# Patient Record
Sex: Female | Born: 1980 | Race: White | Hispanic: No | Marital: Married | Smoking: Never smoker
Health system: Southern US, Community
[De-identification: ages and names within clinical notes are randomized; demographics above are authoritative.]

---
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
## 2000-12-12 ENCOUNTER — Other Ambulatory Visit: Admission: RE | Admit: 2000-12-12 | Discharge: 2000-12-12 | Payer: Self-pay | Admitting: *Deleted

## 2002-09-13 ENCOUNTER — Other Ambulatory Visit: Admission: RE | Admit: 2002-09-13 | Discharge: 2002-09-13 | Payer: Self-pay | Admitting: Obstetrics and Gynecology

## 2018-05-14 ENCOUNTER — Encounter (HOSPITAL_BASED_OUTPATIENT_CLINIC_OR_DEPARTMENT_OTHER): Payer: Self-pay | Admitting: Emergency Medicine

## 2018-05-14 ENCOUNTER — Other Ambulatory Visit: Payer: Self-pay

## 2018-05-14 DIAGNOSIS — K3580 Unspecified acute appendicitis: Principal | ICD-10-CM | POA: Insufficient documentation

## 2018-05-14 DIAGNOSIS — M6208 Separation of muscle (nontraumatic), other site: Secondary | ICD-10-CM | POA: Diagnosis not present

## 2018-05-14 DIAGNOSIS — R109 Unspecified abdominal pain: Secondary | ICD-10-CM | POA: Diagnosis present

## 2018-05-14 DIAGNOSIS — K429 Umbilical hernia without obstruction or gangrene: Secondary | ICD-10-CM | POA: Diagnosis not present

## 2018-05-14 LAB — URINALYSIS, MICROSCOPIC (REFLEX)

## 2018-05-14 LAB — PREGNANCY, URINE: Preg Test, Ur: NEGATIVE

## 2018-05-14 LAB — COMPREHENSIVE METABOLIC PANEL
ALT: 16 U/L (ref 0–44)
AST: 17 U/L (ref 15–41)
Albumin: 4.5 g/dL (ref 3.5–5.0)
Alkaline Phosphatase: 54 U/L (ref 38–126)
Anion gap: 6 (ref 5–15)
BUN: 14 mg/dL (ref 6–20)
CO2: 26 mmol/L (ref 22–32)
Calcium: 9.1 mg/dL (ref 8.9–10.3)
Chloride: 104 mmol/L (ref 98–111)
Creatinine, Ser: 0.52 mg/dL (ref 0.44–1.00)
GFR calc Af Amer: >60 mL/min (ref >60)
GFR calc non Af Amer: >60 mL/min (ref >60)
GLUCOSE: 90 mg/dL (ref 70–99)
Potassium: 4 mmol/L (ref 3.5–5.1)
SODIUM: 136 mmol/L (ref 135–145)
Total Bilirubin: 0.4 mg/dL (ref 0.3–1.2)
Total Protein: 7.5 g/dL (ref 6.5–8.1)

## 2018-05-14 LAB — CBC
HEMATOCRIT: 43.6 % (ref 36.0–46.0)
Hemoglobin: 13.9 g/dL (ref 12.0–15.0)
MCH: 29.3 pg (ref 26.0–34.0)
MCHC: 31.9 g/dL (ref 30.0–36.0)
MCV: 91.8 fL (ref 80.0–100.0)
Platelets: 226 10*3/uL (ref 150–400)
RBC: 4.75 MIL/uL (ref 3.87–5.11)
RDW: 13.2 % (ref 11.5–15.5)
WBC: 15.2 10*3/uL — ABNORMAL HIGH (ref 4.0–10.5)
nRBC: 0 % (ref 0.0–0.2)

## 2018-05-14 LAB — URINALYSIS, ROUTINE W REFLEX MICROSCOPIC
Bilirubin Urine: NEGATIVE
GLUCOSE, UA: NEGATIVE mg/dL
Ketones, ur: NEGATIVE mg/dL
Leukocytes, UA: NEGATIVE
Nitrite: NEGATIVE
Protein, ur: NEGATIVE mg/dL
Specific Gravity, Urine: <1.005 — ABNORMAL LOW (ref 1.005–1.030)
pH: 6 (ref 5.0–8.0)

## 2018-05-14 LAB — LIPASE, BLOOD: Lipase: 29 U/L (ref 11–51)

## 2018-05-14 NOTE — ED Triage Notes (Signed)
Pt c/o 8/10 right lower abd pain since this morning with some nausea no vomiting, pt denies any urinary symptoms.

## 2018-05-15 ENCOUNTER — Emergency Department (HOSPITAL_BASED_OUTPATIENT_CLINIC_OR_DEPARTMENT_OTHER): Payer: Commercial Managed Care - PPO

## 2018-05-15 ENCOUNTER — Encounter (HOSPITAL_COMMUNITY): Payer: Self-pay | Admitting: Certified Registered Nurse Anesthetist

## 2018-05-15 ENCOUNTER — Observation Stay (HOSPITAL_COMMUNITY): Payer: Commercial Managed Care - PPO | Admitting: Certified Registered Nurse Anesthetist

## 2018-05-15 ENCOUNTER — Encounter (HOSPITAL_COMMUNITY): Admission: EM | Disposition: A | Discharge status: Home / Self Care | Payer: Self-pay | Source: Home / Self Care

## 2018-05-15 ENCOUNTER — Observation Stay (HOSPITAL_BASED_OUTPATIENT_CLINIC_OR_DEPARTMENT_OTHER)
Admission: EM | Admit: 2018-05-15 | Discharge: 2018-05-15 | Disposition: A | Discharge status: Home / Self Care | Payer: Commercial Managed Care - PPO | Attending: General Surgery | Admitting: General Surgery

## 2018-05-15 DIAGNOSIS — K3580 Unspecified acute appendicitis: Secondary | ICD-10-CM | POA: Diagnosis present

## 2018-05-15 HISTORY — PX: LAPAROSCOPIC APPENDECTOMY: SHX408

## 2018-05-15 LAB — CBC
HCT: 42.8 % (ref 36.0–46.0)
Hemoglobin: 13.5 g/dL (ref 12.0–15.0)
MCH: 29.9 pg (ref 26.0–34.0)
MCHC: 31.5 g/dL (ref 30.0–36.0)
MCV: 94.7 fL (ref 80.0–100.0)
Platelets: 181 10*3/uL (ref 150–400)
RBC: 4.52 MIL/uL (ref 3.87–5.11)
RDW: 13.5 % (ref 11.5–15.5)
WBC: 9 10*3/uL (ref 4.0–10.5)
nRBC: 0 % (ref 0.0–0.2)

## 2018-05-15 LAB — CREATININE, SERUM
Creatinine, Ser: 0.54 mg/dL (ref 0.44–1.00)
GFR calc Af Amer: >60 mL/min (ref >60)
GFR calc non Af Amer: >60 mL/min (ref >60)

## 2018-05-15 SURGERY — APPENDECTOMY, LAPAROSCOPIC
Anesthesia: General | Site: Abdomen

## 2018-05-15 MED ORDER — FENTANYL CITRATE (PF) 100 MCG/2ML IJ SOLN: 25.0000 ug | INTRAMUSCULAR | Status: DC | PRN

## 2018-05-15 MED ORDER — NEOSTIGMINE METHYLSULFATE 5 MG/5ML IV SOSY
PREFILLED_SYRINGE | INTRAVENOUS | Status: DC | PRN
  Administered 2018-05-15: 3 mg via INTRAVENOUS

## 2018-05-15 MED ORDER — PROPOFOL 10 MG/ML IV BOLUS
INTRAVENOUS | Status: DC | PRN
  Administered 2018-05-15: 140 mg via INTRAVENOUS

## 2018-05-15 MED ORDER — PROCHLORPERAZINE EDISYLATE 10 MG/2ML IJ SOLN: 5.0000 mg | Freq: Four times a day (QID) | INTRAMUSCULAR | Status: DC | PRN

## 2018-05-15 MED ORDER — LIDOCAINE 2% (20 MG/ML) 5 ML SYRINGE
INTRAMUSCULAR | Status: AC
  Filled 2018-05-15: qty 5

## 2018-05-15 MED ORDER — ACETAMINOPHEN 500 MG PO TABS
1000.0000 mg | ORAL_TABLET | Freq: Four times a day (QID) | ORAL | Status: DC
  Administered 2018-05-15: 1000 mg via ORAL
  Filled 2018-05-15: qty 2

## 2018-05-15 MED ORDER — EPHEDRINE SULFATE-NACL 50-0.9 MG/10ML-% IV SOSY
PREFILLED_SYRINGE | INTRAVENOUS | Status: DC | PRN
  Administered 2018-05-15: 15 mg via INTRAVENOUS
  Administered 2018-05-15: 10 mg via INTRAVENOUS

## 2018-05-15 MED ORDER — CELECOXIB 200 MG PO CAPS
200.0000 mg | ORAL_CAPSULE | Freq: Two times a day (BID) | ORAL | Status: DC
  Administered 2018-05-15: 200 mg via ORAL
  Filled 2018-05-15: qty 1

## 2018-05-15 MED ORDER — MORPHINE SULFATE (PF) 2 MG/ML IV SOLN: 1.0000 mg | INTRAVENOUS | Status: DC | PRN

## 2018-05-15 MED ORDER — SENNA 8.6 MG PO TABS
1.0000 | ORAL_TABLET | Freq: Two times a day (BID) | ORAL | Status: DC
  Administered 2018-05-15: 8.6 mg via ORAL
  Filled 2018-05-15: qty 1

## 2018-05-15 MED ORDER — PHENYLEPHRINE 40 MCG/ML (10ML) SYRINGE FOR IV PUSH (FOR BLOOD PRESSURE SUPPORT)
PREFILLED_SYRINGE | INTRAVENOUS | Status: DC | PRN
  Administered 2018-05-15: 120 ug via INTRAVENOUS

## 2018-05-15 MED ORDER — METRONIDAZOLE IN NACL 5-0.79 MG/ML-% IV SOLN
INTRAVENOUS | Status: DC | PRN
  Administered 2018-05-15: 500 mg via INTRAVENOUS

## 2018-05-15 MED ORDER — DEXTROSE IN LACTATED RINGERS 5 % IV SOLN
INTRAVENOUS | Status: DC
  Administered 2018-05-15: 18:00:00 via INTRAVENOUS

## 2018-05-15 MED ORDER — ENOXAPARIN SODIUM 40 MG/0.4ML ~~LOC~~ SOLN: 40.0000 mg | SUBCUTANEOUS | Status: DC

## 2018-05-15 MED ORDER — OXYCODONE HCL 5 MG PO TABS: 5.0000 mg | ORAL_TABLET | ORAL | Status: DC | PRN

## 2018-05-15 MED ORDER — PROMETHAZINE HCL 25 MG/ML IJ SOLN
6.2500 mg | INTRAMUSCULAR | Status: DC | PRN
  Administered 2018-05-15: 6.25 mg via INTRAVENOUS

## 2018-05-15 MED ORDER — ONDANSETRON HCL 4 MG/2ML IJ SOLN
4.0000 mg | Freq: Four times a day (QID) | INTRAMUSCULAR | Status: DC | PRN
  Administered 2018-05-15: 4 mg via INTRAVENOUS

## 2018-05-15 MED ORDER — BUPIVACAINE-EPINEPHRINE 0.25% -1:200000 IJ SOLN
INTRAMUSCULAR | Status: DC | PRN
  Administered 2018-05-15: 23 mL

## 2018-05-15 MED ORDER — SCOPOLAMINE 1 MG/3DAYS TD PT72
MEDICATED_PATCH | TRANSDERMAL | Status: AC
  Administered 2018-05-15: 14:00:00
  Filled 2018-05-15: qty 1

## 2018-05-15 MED ORDER — DIPHENHYDRAMINE HCL 12.5 MG/5ML PO ELIX: 12.5000 mg | ORAL_SOLUTION | Freq: Four times a day (QID) | ORAL | Status: DC | PRN

## 2018-05-15 MED ORDER — KETOROLAC TROMETHAMINE 30 MG/ML IJ SOLN
30.0000 mg | Freq: Four times a day (QID) | INTRAMUSCULAR | Status: DC | PRN
  Administered 2018-05-15: 30 mg via INTRAVENOUS
  Filled 2018-05-15: qty 1

## 2018-05-15 MED ORDER — IBUPROFEN 200 MG PO TABS: 600.0000 mg | ORAL_TABLET | Freq: Four times a day (QID) | ORAL | Status: DC | PRN

## 2018-05-15 MED ORDER — ROCURONIUM BROMIDE 50 MG/5ML IV SOSY
PREFILLED_SYRINGE | INTRAVENOUS | Status: DC | PRN
  Administered 2018-05-15: 30 mg via INTRAVENOUS

## 2018-05-15 MED ORDER — DEXAMETHASONE SODIUM PHOSPHATE 4 MG/ML IJ SOLN
INTRAMUSCULAR | Status: DC | PRN
  Administered 2018-05-15: 10 mg via INTRAVENOUS

## 2018-05-15 MED ORDER — TRAMADOL HCL 50 MG PO TABS: 50.0000 mg | ORAL_TABLET | Freq: Four times a day (QID) | ORAL | Status: DC | PRN

## 2018-05-15 MED ORDER — ONDANSETRON HCL 4 MG/2ML IJ SOLN
INTRAMUSCULAR | Status: AC
  Filled 2018-05-15: qty 2

## 2018-05-15 MED ORDER — METRONIDAZOLE IN NACL 5-0.79 MG/ML-% IV SOLN
500.0000 mg | Freq: Once | INTRAVENOUS | Status: AC
  Administered 2018-05-15: 500 mg via INTRAVENOUS
  Filled 2018-05-15: qty 100

## 2018-05-15 MED ORDER — GLYCOPYRROLATE PF 0.2 MG/ML IJ SOSY
PREFILLED_SYRINGE | INTRAMUSCULAR | Status: DC | PRN
  Administered 2018-05-15: 0.4 mg via INTRAVENOUS

## 2018-05-15 MED ORDER — MORPHINE SULFATE (PF) 2 MG/ML IV SOLN: 2.0000 mg | INTRAVENOUS | Status: DC | PRN

## 2018-05-15 MED ORDER — PROPOFOL 10 MG/ML IV BOLUS
INTRAVENOUS | Status: AC
  Filled 2018-05-15: qty 20

## 2018-05-15 MED ORDER — ACETAMINOPHEN 325 MG PO TABS: 650.0000 mg | ORAL_TABLET | Freq: Four times a day (QID) | ORAL | Status: DC | PRN

## 2018-05-15 MED ORDER — SODIUM CHLORIDE 0.9 % IV BOLUS
1000.0000 mL | Freq: Once | INTRAVENOUS | Status: AC
  Administered 2018-05-15: 1000 mL via INTRAVENOUS

## 2018-05-15 MED ORDER — MIDAZOLAM HCL 2 MG/2ML IJ SOLN
INTRAMUSCULAR | Status: AC
  Filled 2018-05-15: qty 2

## 2018-05-15 MED ORDER — LACTATED RINGERS IV SOLN
INTRAVENOUS | Status: DC | PRN
  Administered 2018-05-15 (×2): via INTRAVENOUS

## 2018-05-15 MED ORDER — IOPAMIDOL (ISOVUE-300) INJECTION 61%
100.0000 mL | Freq: Once | INTRAVENOUS | Status: AC | PRN
  Administered 2018-05-15: 100 mL via INTRAVENOUS

## 2018-05-15 MED ORDER — SODIUM CHLORIDE 0.9 % IV SOLN
1.0000 g | Freq: Once | INTRAVENOUS | Status: AC
  Administered 2018-05-15: 1 g via INTRAVENOUS
  Filled 2018-05-15: qty 10

## 2018-05-15 MED ORDER — DEXAMETHASONE SODIUM PHOSPHATE 10 MG/ML IJ SOLN
INTRAMUSCULAR | Status: AC
  Filled 2018-05-15: qty 1

## 2018-05-15 MED ORDER — SODIUM CHLORIDE 0.9 % IV SOLN
INTRAVENOUS | Status: DC
  Administered 2018-05-15: 06:00:00 via INTRAVENOUS

## 2018-05-15 MED ORDER — MIDAZOLAM HCL 5 MG/5ML IJ SOLN
INTRAMUSCULAR | Status: DC | PRN
  Administered 2018-05-15: 2 mg via INTRAVENOUS

## 2018-05-15 MED ORDER — SUCCINYLCHOLINE CHLORIDE 200 MG/10ML IV SOSY
PREFILLED_SYRINGE | INTRAVENOUS | Status: AC
  Filled 2018-05-15: qty 10

## 2018-05-15 MED ORDER — FENTANYL CITRATE (PF) 250 MCG/5ML IJ SOLN
INTRAMUSCULAR | Status: AC
  Filled 2018-05-15: qty 5

## 2018-05-15 MED ORDER — METRONIDAZOLE IN NACL 5-0.79 MG/ML-% IV SOLN
INTRAVENOUS | Status: AC
  Filled 2018-05-15: qty 100

## 2018-05-15 MED ORDER — LIDOCAINE 2% (20 MG/ML) 5 ML SYRINGE
INTRAMUSCULAR | Status: DC | PRN
  Administered 2018-05-15: 60 mg via INTRAVENOUS

## 2018-05-15 MED ORDER — SUCCINYLCHOLINE CHLORIDE 200 MG/10ML IV SOSY
PREFILLED_SYRINGE | INTRAVENOUS | Status: DC | PRN
  Administered 2018-05-15: 80 mg via INTRAVENOUS

## 2018-05-15 MED ORDER — BUPIVACAINE-EPINEPHRINE (PF) 0.25% -1:200000 IJ SOLN
INTRAMUSCULAR | Status: AC
  Filled 2018-05-15: qty 30

## 2018-05-15 MED ORDER — PROCHLORPERAZINE MALEATE 10 MG PO TABS: 10.0000 mg | ORAL_TABLET | Freq: Four times a day (QID) | ORAL | Status: DC | PRN

## 2018-05-15 MED ORDER — OXYCODONE HCL 5 MG PO TABS: 5.0000 mg | ORAL_TABLET | Freq: Four times a day (QID) | ORAL | 0 refills | Status: AC | PRN

## 2018-05-15 MED ORDER — ACETAMINOPHEN 650 MG RE SUPP: 650.0000 mg | Freq: Four times a day (QID) | RECTAL | Status: DC | PRN

## 2018-05-15 MED ORDER — DIPHENHYDRAMINE HCL 50 MG/ML IJ SOLN: 12.5000 mg | Freq: Four times a day (QID) | INTRAMUSCULAR | Status: DC | PRN

## 2018-05-15 MED ORDER — ONDANSETRON 4 MG PO TBDP: 4.0000 mg | ORAL_TABLET | Freq: Four times a day (QID) | ORAL | Status: DC | PRN

## 2018-05-15 MED ORDER — FENTANYL CITRATE (PF) 100 MCG/2ML IJ SOLN
INTRAMUSCULAR | Status: DC | PRN
  Administered 2018-05-15: 100 ug via INTRAVENOUS
  Administered 2018-05-15: 50 ug via INTRAVENOUS
  Administered 2018-05-15: 100 ug via INTRAVENOUS

## 2018-05-15 MED ORDER — ROCURONIUM BROMIDE 10 MG/ML (PF) SYRINGE
PREFILLED_SYRINGE | INTRAVENOUS | Status: AC
  Filled 2018-05-15: qty 10

## 2018-05-15 MED ORDER — PROMETHAZINE HCL 25 MG/ML IJ SOLN
INTRAMUSCULAR | Status: AC
  Administered 2018-05-15: 6.25 mg via INTRAVENOUS
  Filled 2018-05-15: qty 1

## 2018-05-15 SURGICAL SUPPLY — 36 items
APPLIER CLIP 5 13 M/L LIGAMAX5 (MISCELLANEOUS)
APPLIER CLIP ROT 10 11.4 M/L (STAPLE)
CABLE HIGH FREQUENCY MONO STRZ (ELECTRODE) ×3 IMPLANT
CHLORAPREP W/TINT 26ML (MISCELLANEOUS) ×3 IMPLANT
CLIP APPLIE 5 13 M/L LIGAMAX5 (MISCELLANEOUS) IMPLANT
CLIP APPLIE ROT 10 11.4 M/L (STAPLE) IMPLANT
COVER SURGICAL LIGHT HANDLE (MISCELLANEOUS) ×3 IMPLANT
COVER WAND RF STERILE (DRAPES) ×3 IMPLANT
CUTTER FLEX LINEAR 45M (STAPLE) ×3 IMPLANT
DECANTER SPIKE VIAL GLASS SM (MISCELLANEOUS) IMPLANT
DERMABOND ADVANCED (GAUZE/BANDAGES/DRESSINGS) ×2
DERMABOND ADVANCED .7 DNX12 (GAUZE/BANDAGES/DRESSINGS) ×1 IMPLANT
DRAPE LAPAROSCOPIC ABDOMINAL (DRAPES) ×3 IMPLANT
ELECT REM PT RETURN 15FT ADLT (MISCELLANEOUS) ×3 IMPLANT
GLOVE BIOGEL PI IND STRL 7.5 (GLOVE) ×1 IMPLANT
GLOVE BIOGEL PI INDICATOR 7.5 (GLOVE) ×2
GLOVE ECLIPSE 7.5 STRL STRAW (GLOVE) ×3 IMPLANT
GOWN STRL REUS W/TWL XL LVL3 (GOWN DISPOSABLE) ×6 IMPLANT
KIT BASIN OR (CUSTOM PROCEDURE TRAY) ×3 IMPLANT
PAD POSITIONING PINK XL (MISCELLANEOUS) ×3 IMPLANT
POUCH RETRIEVAL ECOSAC 10 (ENDOMECHANICALS) ×1 IMPLANT
POUCH RETRIEVAL ECOSAC 10MM (ENDOMECHANICALS) ×2
POUCH SPECIMEN RETRIEVAL 10MM (ENDOMECHANICALS) IMPLANT
RELOAD 45 VASCULAR/THIN (ENDOMECHANICALS) IMPLANT
RELOAD STAPLE TA45 3.5 REG BLU (ENDOMECHANICALS) ×3 IMPLANT
SCISSORS LAP 5X35 DISP (ENDOMECHANICALS) ×3 IMPLANT
SET IRRIG TUBING LAPAROSCOPIC (IRRIGATION / IRRIGATOR) ×3 IMPLANT
SHEARS HARMONIC ACE PLUS 36CM (ENDOMECHANICALS) ×3 IMPLANT
SLEEVE XCEL OPT CAN 5 100 (ENDOMECHANICALS) ×3 IMPLANT
SUT MNCRL AB 4-0 PS2 18 (SUTURE) ×3 IMPLANT
TOWEL OR 17X26 10 PK STRL BLUE (TOWEL DISPOSABLE) ×3 IMPLANT
TRAY FOLEY MTR SLVR 16FR STAT (SET/KITS/TRAYS/PACK) ×3 IMPLANT
TRAY LAPAROSCOPIC (CUSTOM PROCEDURE TRAY) ×3 IMPLANT
TROCAR BLADELESS OPT 5 100 (ENDOMECHANICALS) ×3 IMPLANT
TROCAR XCEL BLUNT TIP 100MML (ENDOMECHANICALS) ×3 IMPLANT
TUBING INSUF HEATED (TUBING) ×3 IMPLANT

## 2018-05-15 NOTE — ED Provider Notes (Signed)
[facility] EMERGENCY DEPARTMENT Provider Note   CSN: [id number] Arrival date & time: 05/14/18  1948     History   Chief Complaint Chief Complaint  Patient presents with  . Abdominal Pain    HPI Marisa Patel is a 37 y.o. female.  Patient is a 37 year old female with past medical history of childbirth x3.  She presents today with complaints of right lower quadrant abdominal pain.  This began this morning in the absence of any injury or trauma.  She initially felt somewhat bloated and as if she may have eaten too much over the holidays, however the pain worsened and localized to the right lower quadrant.  She denies any fevers or chills.  She denies any bowel or bladder complaints.  The history is provided by the patient.  Abdominal Pain   This is a new problem. Episode onset: This morning. The problem occurs constantly. The problem has been gradually worsening. The pain is associated with an unknown factor. The pain is located in the RLQ. The quality of the pain is sharp. The pain is moderate. Pertinent negatives include fever, hematochezia, nausea, vomiting and constipation. The symptoms are aggravated by certain positions and palpation.    History reviewed. No pertinent past medical history.  There are no active problems to display for this patient.   History reviewed. No pertinent surgical history.   OB History   No obstetric history on file.      Home Medications    Prior to Admission medications   Not on File    Family History History reviewed. No pertinent family history.  Social History Social History   Tobacco Use  . Smoking status: Never Smoker  . Smokeless tobacco: Never Used  Substance Use Topics  . Alcohol use: Never    Frequency: Never  . Drug use: Never     Allergies   Neosporin [neomycin-bacitracin zn-polymyx] and Peanut-containing drug products   Review of Systems Review of Systems  Constitutional: Negative for fever.    Gastrointestinal: Positive for abdominal pain. Negative for constipation, hematochezia, nausea and vomiting.  All other systems reviewed and are negative.    Physical Exam Updated Vital Signs BP 102/63 (BP Location: Right Arm)   Pulse 72   Temp 99.9 F (37.7 C) (Oral)   Resp 16   Ht 5\' 8"  (1.727 m)   Wt 61.2 kg   SpO2 100%   BMI 20.53 kg/m   Physical Exam Vitals signs and nursing note reviewed.  Constitutional:      General: She is not in acute distress.    Appearance: She is well-developed. She is not diaphoretic.  HENT:     Head: Normocephalic and atraumatic.  Neck:     Musculoskeletal: Normal range of motion and neck supple.  Cardiovascular:     Rate and Rhythm: Normal rate and regular rhythm.     Heart sounds: No murmur. No friction rub. No gallop.   Pulmonary:     Effort: Pulmonary effort is normal. No respiratory distress.     Breath sounds: Normal breath sounds. No wheezing.  Abdominal:     General: Bowel sounds are normal. There is no distension.     Palpations: Abdomen is soft.     Tenderness: There is abdominal tenderness in the right lower quadrant. There is no guarding or rebound.  Musculoskeletal: Normal range of motion.  Skin:    General: Skin is warm and dry.  Neurological:     Mental Status: She is  alert and oriented to person, place, and time.      ED Treatments / Results  Labs (all labs ordered are listed, but only abnormal results are displayed) Labs Reviewed  CBC - Abnormal; Notable for the following components:      Result Value   WBC 15.2 (*)    All other components within normal limits  URINALYSIS, ROUTINE W REFLEX MICROSCOPIC - Abnormal; Notable for the following components:   Specific Gravity, Urine <1.005 (*)    Hgb urine dipstick TRACE (*)    All other components within normal limits  URINALYSIS, MICROSCOPIC (REFLEX) - Abnormal; Notable for the following components:   Bacteria, UA FEW (*)    All other components within normal  limits  LIPASE, BLOOD  COMPREHENSIVE METABOLIC PANEL  PREGNANCY, URINE    EKG None  Radiology No results found.  Procedures Procedures (including critical care time)  Medications Ordered in ED Medications  sodium chloride 0.9 % bolus 1,000 mL (has no administration in time range)     Initial Impression / Assessment and Plan / ED Course  I have reviewed the triage vital signs and the nursing notes.  Pertinent labs & imaging results that were available during my care of the patient were reviewed by me and considered in my medical decision making (see chart for details).  Patient presents with right lower quadrant abdominal pain.  Work-up reveals a white count of 15,000 and and CT scan confirms acute appendicitis.  This was discussed with Dr. Donell BeersByerly who agrees to accept the patient in transfer.  After waiting for a bed assignment with no available beds, patient will be made in ER to ER transfer.  Dr. Read DriversMolpus agrees to accept and Dr. Donell BeersByerly aware of and agrees with this change in plan.  Patient was given Rocephin and Flagyl.  She has declined any pain medication while here in the ER.  Final Clinical Impressions(s) / ED Diagnoses   Final diagnoses:  None    ED Discharge Orders    None       Geoffery Lyonselo, Merrick Maggio, MD 05/15/18 [phone]

## 2018-05-15 NOTE — Discharge Instructions (Signed)
[facility] ______[facility] °LAPAROSCOPIC SURGERY: POST OP INSTRUCTIONS °Always review your discharge instruction sheet given to you by the facility where your surgery was performed. °IF YOU HAVE DISABILITY OR FAMILY LEAVE FORMS, YOU MUST BRING THEM TO THE OFFICE FOR PROCESSING.   °DO NOT GIVE THEM TO YOUR DOCTOR. ° °1. A prescription for pain medication may be given to you upon discharge.  Take your pain medication as prescribed, if needed.  If narcotic pain medicine is not needed, then you may take acetaminophen (Tylenol) or ibuprofen (Advil) as needed. °2. Take your usually prescribed medications unless otherwise directed. °3. If you need a refill on your pain medication, please contact your pharmacy.  They will contact our office to request authorization. Prescriptions will not be filled after 5pm or on week-ends. °4. You should follow a light diet the first few days after arrival home, such as soup and crackers, etc.  Be sure to include lots of fluids daily. °5. Most patients will experience some swelling and bruising in the area of the incisions.  Ice packs will help.  Swelling and bruising can take several days to resolve.  °6. It is common to experience some constipation if taking pain medication after surgery.  Increasing fluid intake and taking a stool softener (such as Colace) will usually help or prevent this problem from occurring.  A mild laxative (Milk of Magnesia or Miralax) should be taken according to package instructions if there are no bowel movements after 48 hours. °7. Unless discharge instructions indicate otherwise, you may remove your bandages 24-48 hours after surgery, and you may shower at that time.  You may have steri-strips (small skin tapes) in place directly over the incision.  These strips should be left on the skin for 7-10 days.  If your surgeon used skin glue on the incision, you may shower in 24 hours.  The glue will flake off over the next 2-3 weeks.  Any sutures or  staples will be removed at the office during your follow-up visit. °8. ACTIVITIES:  You may resume regular (light) daily activities beginning the next day--such as daily self-care, walking, climbing stairs--gradually increasing activities as tolerated.  You may have sexual intercourse when it is comfortable.  Refrain from any heavy lifting or straining until approved by your doctor. °a. You may drive when you are no longer taking prescription pain medication, you can comfortably wear a seatbelt, and you can safely maneuver your car and apply brakes. °b. RETURN TO WORK:  __________________________________________________________ °9. You should see your doctor in the office for a follow-up appointment approximately 2-3 weeks after your surgery.  Make sure that you call for this appointment within a day or two after you arrive home to insure a convenient appointment time. °10. OTHER INSTRUCTIONS: __________________________________________________________________________________________________________________________ __________________________________________________________________________________________________________________________ °WHEN TO CALL YOUR DOCTOR: °1. Fever over 101.0 °2. Inability to urinate °3. Continued bleeding from incision. °4. Increased pain, redness, or drainage from the incision. °5. Increasing abdominal pain ° °The clinic staff is available to answer your questions during regular business hours.  Please don’t hesitate to call and ask to speak to one of the nurses for clinical concerns.  If you have a medical emergency, go to the nearest emergency room or call 911.  A surgeon from [facility] is always on call at the hospital. °[address], [address], [place], [place]  [address] ? [address] [address], [place], [place]   [address] °[phone] ? [phone] ? FAX [phone] °Web site:   [email or web address] °

## 2018-05-15 NOTE — Anesthesia Postprocedure Evaluation (Signed)
Anesthesia Post Note  Patient: Marisa Patel  Procedure(s) Performed: APPENDECTOMY LAPAROSCOPIC (N/A Abdomen)     Patient location during evaluation: PACU Anesthesia Type: General Level of consciousness: awake and alert Pain management: pain level controlled Vital Signs Assessment: post-procedure vital signs reviewed and stable Respiratory status: spontaneous breathing, nonlabored ventilation, respiratory function stable and patient connected to nasal cannula oxygen Cardiovascular status: blood pressure returned to baseline and stable Postop Assessment: no apparent nausea or vomiting Anesthetic complications: no    Last Vitals:  Vitals:   05/15/18 1700 05/15/18 1717  BP: 103/61 105/62  Pulse: 64 65  Resp: 12 16  Temp: 36.6 C 36.4 C  SpO2: 100% 100%    Last Pain:  Vitals:   05/15/18 1717  TempSrc: Oral  PainSc: 3                  Cecile HearingStephen Edward Turk

## 2018-05-15 NOTE — Anesthesia Preprocedure Evaluation (Addendum)
Anesthesia Evaluation  Patient identified by MRN, date of birth, ID band Patient awake    Reviewed: Allergy & Precautions, NPO status , Patient's Chart, lab work & pertinent test results  Airway Mallampati: II  TM Distance: >3 FB Neck ROM: Full    Dental  (+) Teeth Intact, Dental Advisory Given   Pulmonary neg pulmonary ROS,    Pulmonary exam normal breath sounds clear to auscultation       Cardiovascular Exercise Tolerance: Good negative cardio ROS Normal cardiovascular exam Rhythm:Regular Rate:Normal     Neuro/Psych negative neurological ROS  negative psych ROS   GI/Hepatic Neg liver ROS, Appendicitis    Endo/Other  negative endocrine ROS  Renal/GU negative Renal ROS     Musculoskeletal negative musculoskeletal ROS (+)   Abdominal   Peds  Hematology negative hematology ROS (+)   Anesthesia Other Findings Day of surgery medications reviewed with the patient.  Reproductive/Obstetrics IUD                            Anesthesia Physical Anesthesia Plan  ASA: II  Anesthesia Plan: General   Post-op Pain Management:    Induction: Intravenous and Rapid sequence  PONV Risk Score and Plan: 4 or greater and Midazolam, Dexamethasone, Ondansetron and Scopolamine patch - Pre-op  Airway Management Planned: Oral ETT  Additional Equipment:   Intra-op Plan:   Post-operative Plan: Extubation in OR  Informed Consent: I have reviewed the patients History and Physical, chart, labs and discussed the procedure including the risks, benefits and alternatives for the proposed anesthesia with the patient or authorized representative who has indicated his/her understanding and acceptance.   Dental advisory given  Plan Discussed with: CRNA  Anesthesia Plan Comments:        Anesthesia Quick Evaluation

## 2018-05-15 NOTE — H&P (Signed)
Marisa Patel is an 37 y.o. female.   Chief Complaint: Acute appendicitis HPI:  Pt is a 37 yo F who presented to [facility] ED with abdominal pain starting Monday dec 30 at around 2 pm.  This was at first a sense of bloating and indigestion.  It progressively worsened.  Her sister (or sister in law?) is a physical therapist and had her lay down and pressed on her abdomen. When she was tender in the RLQ and continued to have worsening discomfort, she was sent to the ED.  She does not have a big appetite.  She has some nausea but no vomiting. No Fever/chills.    She works from home.  She has no prior surgical history but has had a colonoscopy in [place] 12 years ago or so.    History reviewed. No pertinent past medical history.  3 NSVD  History reviewed. No pertinent surgical history.  History reviewed. No pertinent family history. Social History:  reports that she has never smoked. She has never used smokeless tobacco. She reports that she does not drink alcohol or use drugs.  Allergies:  Allergies  Allergen Reactions  . Neosporin [Neomycin-Bacitracin Zn-Polymyx]   . Peanut-Containing Drug Products     . No outpatient medications have been marked as taking for the 05/15/18 encounter [facility] Encounter).     Results for orders placed or performed during the hospital encounter of 05/15/18 (from the past 48 hour(s))  Lipase, blood     Status: None   Collection Time: 05/14/18  8:04 PM  Result Value Ref Range   Lipase 29 11 - 51 U/L    Comment: Performed at [facility], [address] [place], [place] [address]  Comprehensive metabolic panel     Status: None   Collection Time: 05/14/18  8:04 PM  Result Value Ref Range   Sodium 136 135 - 145 mmol/L   Potassium 4.0 3.5 - 5.1 mmol/L   Chloride 104 98 - 111 mmol/L   CO2 26 22 - 32 mmol/L   Glucose, Bld 90 70 - 99 mg/dL   BUN 14 6 - 20 mg/dL   Creatinine, Ser 6.04 0.44 - 1.00 mg/dL   Calcium 9.1 8.9 - 54.0 mg/dL    Total Protein 7.5 6.5 - 8.1 g/dL   Albumin 4.5 3.5 - 5.0 g/dL   AST 17 15 - 41 U/L   ALT 16 0 - 44 U/L   Alkaline Phosphatase 54 38 - 126 U/L   Total Bilirubin 0.4 0.3 - 1.2 mg/dL   GFR calc non Af Amer >60 >60 mL/min   GFR calc Af Amer >60 >60 mL/min   Anion gap 6 5 - 15    Comment: Performed at [facility], [address] [place], [place] [address]  CBC     Status: Abnormal   Collection Time: 05/14/18  8:04 PM  Result Value Ref Range   WBC 15.2 (H) 4.0 - 10.5 K/uL   RBC 4.75 3.87 - 5.11 MIL/uL   Hemoglobin 13.9 12.0 - 15.0 g/dL   HCT 14.7 82.9 - 56.2 %   MCV 91.8 80.0 - 100.0 fL   MCH 29.3 26.0 - 34.0 pg   MCHC 31.9 30.0 - 36.0 g/dL   RDW 13.0 86.5 - 78.4 %   Platelets 226 150 - 400 K/uL   nRBC 0.0 0.0 - 0.2 %    Comment: Performed at [facility], [address]  [place], [place] [address]  Urinalysis, Routine w reflex microscopic     Status: Abnormal   Collection Time: 05/14/18  8:05 PM  Result Value Ref Range   Color, Urine YELLOW YELLOW   APPearance CLEAR CLEAR   Specific Gravity, Urine <1.005 (L) 1.005 - 1.030   pH 6.0 5.0 - 8.0   Glucose, UA NEGATIVE NEGATIVE mg/dL   Hgb urine dipstick TRACE (A) NEGATIVE   Bilirubin Urine NEGATIVE NEGATIVE   Ketones, ur NEGATIVE NEGATIVE mg/dL   Protein, ur NEGATIVE NEGATIVE mg/dL   Nitrite NEGATIVE NEGATIVE   Leukocytes, UA NEGATIVE NEGATIVE    Comment: Performed at [facility] [place], [address] [place], [place] [address]  Pregnancy, urine     Status: None   Collection Time: 05/14/18  8:05 PM  Result Value Ref Range   Preg Test, Ur NEGATIVE NEGATIVE    Comment:        THE SENSITIVITY OF THIS METHODOLOGY IS >20 mIU/mL. Performed at [facility] [place], [address] [place], [place] [address]   Urinalysis, Microscopic (reflex)     Status: Abnormal   Collection Time: 05/14/18  8:05 PM  Result Value Ref Range   RBC / HPF 0-5 0 - 5 RBC/hpf   WBC, UA 0-5 0 - 5 WBC/hpf   Bacteria, UA FEW  (A) NONE SEEN   Squamous Epithelial / LPF 0-5 0 - 5    Comment: Performed at [facility] [place], [address] [place], [place] [address]   Ct Abdomen Pelvis W Contrast  Result Date: 05/15/2018 CLINICAL DATA:  Right lower quadrant abdominal pain for 14 hours with nausea and leukocytosis. EXAM: CT ABDOMEN AND PELVIS WITH CONTRAST TECHNIQUE: Multidetector CT imaging of the abdomen and pelvis was performed using the standard protocol following bolus administration of intravenous contrast. CONTRAST:  100mL ISOVUE-300 IOPAMIDOL (ISOVUE-300) INJECTION 61% COMPARISON:  None. FINDINGS: Lower chest: No significant pulmonary nodules or acute consolidative airspace disease. Hepatobiliary: Normal liver size. No liver mass. Normal gallbladder with no radiopaque cholelithiasis. No biliary ductal dilatation. Pancreas: Normal, with no mass or duct dilation. Spleen: Normal size. No mass. Adrenals/Urinary Tract: Normal adrenals. Normal kidneys with no hydronephrosis and no renal mass. Normal bladder. Stomach/Bowel: Normal non-distended stomach. Normal caliber small bowel with no small bowel wall thickening. Diffuse dilatation of the appendix with diffuse appendiceal wall thickening and hyperenhancement and mild periappendiceal fat stranding, compatible with acute appendicitis. Appendix: Location: Right lower quadrant Diameter: 10 mm Appendicolith: Not present Mucosal hyper-enhancement: Present Extraluminal gas: Not present Periappendiceal collection: Not present Normal large bowel with no diverticulosis, large bowel wall thickening or pericolonic fat stranding. Vascular/Lymphatic: Normal caliber abdominal aorta. Patent portal, splenic, hepatic and renal veins. No pathologically enlarged lymph nodes in the abdomen or pelvis. Reproductive: Normal anteverted uterus with grossly well-positioned intrauterine device. No suspicious adnexal masses. Other: No pneumoperitoneum, ascites or focal fluid collection. Musculoskeletal:  No aggressive appearing focal osseous lesions. IMPRESSION: Acute appendicitis. No perforation or abscess.  No appendicolith. Electronically Signed   By: Delbert PhenixJason A Poff M.D.   On: 05/15/2018 02:42    Review of Systems  Constitutional: Negative.   HENT: Negative.   Eyes: Negative.   Respiratory: Negative.   Cardiovascular: Negative.   Gastrointestinal: Positive for abdominal pain and nausea.       Bloating  Genitourinary: Negative.   Musculoskeletal: Negative.   Skin: Negative.   Neurological: Negative.   Endo/Heme/Allergies: Negative.   Psychiatric/Behavioral: Negative.   All other systems  reviewed and are negative.   Blood pressure 93/61, pulse 72, temperature 98.8 F (37.1 C), temperature source Oral, resp. rate 12, height 5\' 8"  (1.727 m), weight 61.2 kg, SpO2 97 %. Physical Exam  Constitutional: She is oriented to person, place, and time. She appears well-developed and well-nourished. No distress.  HENT:  Head: Normocephalic and atraumatic.  Right Ear: External ear normal.  Left Ear: External ear normal.  Eyes: Pupils are equal, round, and reactive to light. Conjunctivae are normal. No scleral icterus.  Neck: Neck supple. No tracheal deviation present.  Cardiovascular: Normal rate, regular rhythm and intact distal pulses.  Respiratory: Effort normal. No respiratory distress.  GI: Soft. She exhibits distension (mildly distended). She exhibits no mass. There is abdominal tenderness. There is guarding (voluntary guarding RLQ, mild). There is no rebound.  + rovsing sign  Mild rectus diastasis near umbilicus.  < 1 cm hernia defect at umbilicus. Cannot appreciate other abdominal wall hernia.    Neurological: She is alert and oriented to person, place, and time. Coordination normal.  Skin: Skin is warm and dry. No rash noted. She is not diaphoretic. No erythema. No pallor.  Psychiatric: She has a normal mood and affect. Her behavior is normal. Judgment and thought content normal.      Assessment/Plan Acute appendicitis. Appears to be uncomplicated and early from story, labs, and CT.   Will plan surgery later today. Iv fluids Iv antibiotics NPO  Diastasis recti Tiny umbilical hernia   Appendectomy was described to the patient and family as well as natural history of appendicitis.  The incisions and surgical technique were explained.  The patient was advised that some of the hair on the abdomen may possibly be clipped, and that a foley catheter would be placed.  I advised the patient of the risks of surgery including, but not limited to, bleeding, infection, damage to other structures, risk of an open operation, risk of abscess, and risk of blood clot.  The recovery was also described to the patient.  She was advised that he will have lifting restrictions for 2 weeks.    I advised the patient that formal hernia repair is not done at the time of appendectomy, but extent of hernia vs diastasis could potentially be evaluated laparoscopically.    This is posted for Dr. Johna SheriffHoxworth for later today barring unforeseen consequences.    Almond LintFaera Katharine Rochefort, MD 05/15/2018, 8:29 AM

## 2018-05-15 NOTE — Progress Notes (Signed)
Patient discharged to home with husband,  discharge instructions given, patient understands and had no questions. Patient vitals stable, doctor informed of patient's agreeing to discharge.

## 2018-05-15 NOTE — Transfer of Care (Signed)
Immediate Anesthesia Transfer of Care Note  Patient: Marisa Patel  Procedure(s) Performed: APPENDECTOMY LAPAROSCOPIC (N/A Abdomen)  Patient Location: PACU  Anesthesia Type:General  Level of Consciousness: awake, alert  and oriented  Airway & Oxygen Therapy: Patient Spontanous Breathing and Patient connected to face mask oxygen  Post-op Assessment: Report given to RN and Post -op Vital signs reviewed and stable  Post vital signs: Reviewed and stable  Last Vitals:  Vitals Value Taken Time  BP 106/57 05/15/2018  4:04 PM  Temp    Pulse 80 05/15/2018  4:07 PM  Resp 15 05/15/2018  4:07 PM  SpO2 100 % 05/15/2018  4:07 PM  Vitals shown include unvalidated device data.  Last Pain:  Vitals:   05/15/18 1251  TempSrc: (P) Oral  PainSc:          Complications: No apparent anesthesia complications

## 2018-05-15 NOTE — ED Notes (Signed)
Patient was given CHG bath for OR procedure. Patient signed OR consent and this writer placed consent at bedside.

## 2018-05-15 NOTE — Interval H&P Note (Signed)
History and Physical Interval Note:  05/15/2018 2:04 PM  Leatha GildingHelena S Pillsbury  has presented today for surgery, with the diagnosis of appendix  The various methods of treatment have been discussed with the patient and family. After consideration of risks, benefits and other options for treatment, the patient has consented to  Procedure(s): APPENDECTOMY LAPAROSCOPIC (N/A) as a surgical intervention .  The patient's history has been reviewed, patient examined, no change in status, stable for surgery.  I have reviewed the patient's chart and labs.  Questions were answered to the patient's satisfaction.     Lorne SkeensBenjamin T Reba Hulett

## 2018-05-15 NOTE — ED Notes (Signed)
Patient transported to CT 

## 2018-05-15 NOTE — Op Note (Signed)
Preoperative Diagnosis: Acute appendicitis  Postoprative Diagnosis: Same  Procedure: Procedure(s): APPENDECTOMY LAPAROSCOPIC   Surgeon: Glenna FellowsHoxworth, Nakkia Mackiewicz T   Assistants: None  Anesthesia:  General endotracheal anesthesia  Indications: Patient is a generally healthy 37 year old female who presents with 24 hours of right lower quadrant abdominal pain and findings typical for appendicitis which has been confirmed on CT scan.  We have recommended proceeding with urgent laparoscopic appendectomy.  The nature of the procedure and its indications and risks have been discussed in detail with the patient documented elsewhere and she agrees to proceed.    Procedure Detail: Patient was brought to the operating room, placed in the supine position on the operating table, and general endotracheal anesthesia induced.  The abdomen was widely sterilely prepped and draped after placement of a Foley catheter.  Patient timeout was performed and correct procedure verified.  She had received broad-spectrum preoperative IV antibiotics.  Trocar sites were infiltrated with local anesthesia.  Access was obtained with a open Hassan cannula at the umbilicus with a 1 and half centimeter incision.  A small umbilical hernia was dissected and freed from umbilical skin and the edges defined.  This measured less than 1 cm.  It was dilated slightly and [facility] with 2 stay sutures of 0 Vicryl and pneumoperitoneum established.  Under direct vision 5 mm trochars were placed in the epigastrium and in the left lower quadrant.  The appendix was visualized lying inferior and lateral to the cecum.  It was mobilized dividing lateral peritoneal attachments mobilizing the appendix and the tip of the cecum.  There was no evidence of gangrene or perforation.  The mesoappendix was sequentially divided with the harmonic scalpel.  A couple of adhesions of the terminal ileum up toward the base of the appendix were carefully divided  protecting the terminal ileum.  The appendix was completely freed down to the tip of the cecum.  The appendix was then divided across the tip of the cecum with a single firing of the Endo GIA 45 mm blue load stapler.  The staple line was intact and without bleeding.  The appendix was placed in an eco-sac and brought out through the umbilical incision.  The abdomen was irrigated and carefully inspected.  There was no bleeding or evidence of injury.  Patient had complained of a possible hernia in the supraumbilical or epigastric area.  I carefully examined this laparoscopically and I did not see any evidence of discrete hernia, probably a moderate diastases.  The umbilical defect was closed with interrupted 0 Vicryl.  Skin incisions were closed with subcuticular Monocryl and Dermabond.  Sponge needle and instrument counts were correct.   Findings: Acute appendicitis without perforation or gangrene  Estimated Blood Loss:  Minimal         Drains: None  Blood Given: none          Specimens: Appendix        Complications:  * No complications entered in OR log *         Disposition: PACU - hemodynamically stable.         Condition: stable

## 2018-05-15 NOTE — Anesthesia Procedure Notes (Signed)
Procedure Name: Intubation Performed by: Gean Maidens, CRNA Pre-anesthesia Checklist: Patient identified, Emergency Drugs available, Suction available, Patient being monitored and Timeout performed Patient Re-evaluated:Patient Re-evaluated prior to induction Oxygen Delivery Method: Circle system utilized Preoxygenation: Pre-oxygenation with 100% oxygen Induction Type: IV induction Ventilation: Mask ventilation without difficulty Laryngoscope Size: Mac and 4 Grade View: Grade I Tube type: Oral Tube size: 7.5 mm Number of attempts: 1 Airway Equipment and Method: Stylet Placement Confirmation: ETT inserted through vocal cords under direct vision,  positive ETCO2 and breath sounds checked- equal and bilateral Secured at: 21 cm Tube secured with: Tape Dental Injury: Teeth and Oropharynx as per pre-operative assessment

## 2018-05-16 ENCOUNTER — Encounter (HOSPITAL_COMMUNITY): Payer: Self-pay | Admitting: General Surgery

## 2018-05-16 LAB — HIV ANTIBODY (ROUTINE TESTING W REFLEX): HIV Screen 4th Generation wRfx: NONREACTIVE

## 2018-05-17 NOTE — Discharge Summary (Signed)
  Patient ID: Marisa GildingHelena S Patel [id number] 37 y.o. 11/24/1980  05/15/2018  Discharge date and time: May 15, 2018  Admitting Physician: Maryruth EveFeara Byerly  Discharge Physician: Mariella SaaBenjamin T Ramyah Pankowski  Admission Diagnoses: Acute appendicitis [K35.80] Acute appendicitis, unspecified acute appendicitis type [K35.80]  Discharge Diagnoses: same  Operations: Procedure(s): APPENDECTOMY LAPAROSCOPIC  Admission Condition: fair  Discharged Condition: good  Indication for Admission: patient is a generally healthy 38 year old female who presented with typical progressive right lower quadrant abdominal pain and CT scan has indicated uncomplicated appendicitis.  She is admitted for emergency laparoscopic appendectomy.  Hospital Course: on the day of admission the patient received IV fluids and broad-spectrum IV antibiotics.  She was taken to the operating room and underwent an uneventful laparoscopic appendectomy.  Also repair incidentally of a small umbilical hernia.  That same evening she was comfortable, tolerating diet and ambulatory and desired to go home.  Abdomen benign and was clean.    Disposition: home  Patient Instructions:  Allergies as of 05/15/2018      Reactions   Other Anaphylaxis   Walnuts   Neosporin [neomycin-bacitracin Zn-polymyx] Other (See Comments)   Blisters      Medication List    TAKE these medications   Digestive Enzyme Caps Take 1 capsule by mouth daily.   ibuprofen 200 MG tablet Commonly known as:  ADVIL,MOTRIN Take 400 mg by mouth every 6 (six) hours as needed for mild pain.   levonorgestrel 20 MCG/24HR IUD Commonly known as:  MIRENA 1 each by Intrauterine route once.   loperamide 2 MG capsule Commonly known as:  IMODIUM Take 2 mg by mouth daily as needed for diarrhea or loose stools.   oxyCODONE 5 MG immediate release tablet Commonly known as:  Oxy IR/ROXICODONE Take 1 tablet (5 mg total) by mouth every 6 (six) hours as needed for moderate pain,  severe pain or breakthrough pain.       Activity: activity as tolerated Diet: regular diet Wound Care: none needed  Follow-up:  With Dr. Johna SheriffHoxworth in 2 weeks.  Signed: Mariella SaaBenjamin T Donny Heffern MD, FACS  05/17/2018, 3:03 PM

## 2019-06-09 IMAGING — CT CT ABD-PELV W/ CM
2 of 4 series · 15 of 46 positions shown, 17 images · IV contrast (APPLIED)
Comparison: None.

CLINICAL DATA: Right lower quadrant abdominal pain for 14 hours
with nausea and leukocytosis.

EXAM:
CT ABDOMEN AND PELVIS WITH CONTRAST
TECHNIQUE: Multidetector CT imaging of the abdomen and pelvis was performed
using the standard protocol following bolus administration of
intravenous contrast.
CONTRAST:  100mL 1TS1GC-CMM IOPAMIDOL (1TS1GC-CMM) INJECTION 61%

[Series 2: axial st · axial · 0.80mm/px · z∈[+195,+635]mm · 12 of 102 slices shown, 14 images]
[im 9/102  soft-tissue]
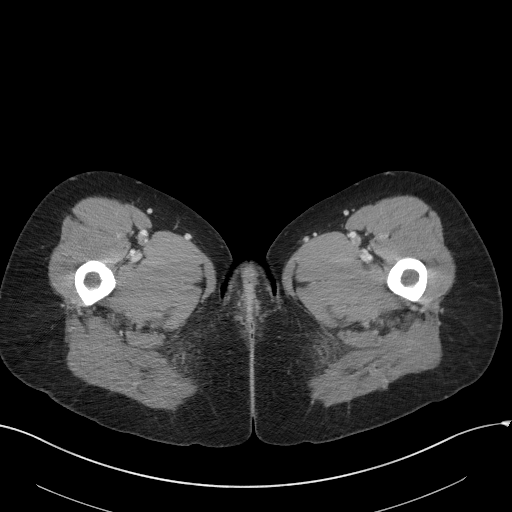
[im 9/102  bone]
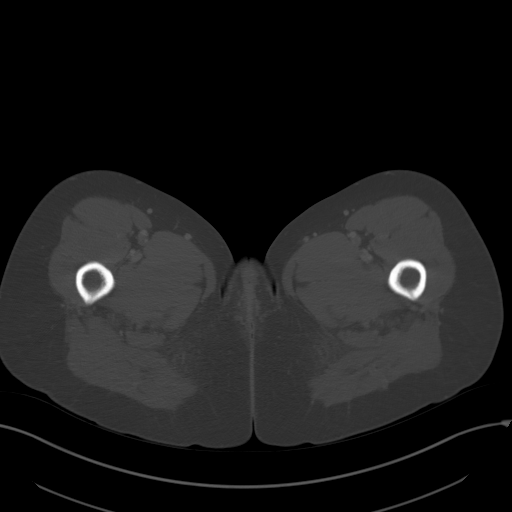
[im 17/102  soft-tissue]
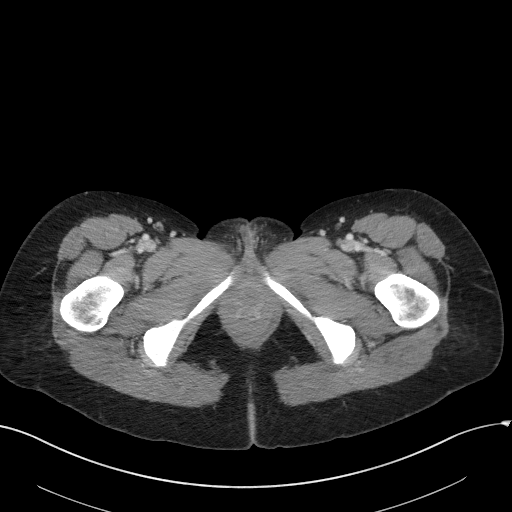
[im 25/102  soft-tissue]
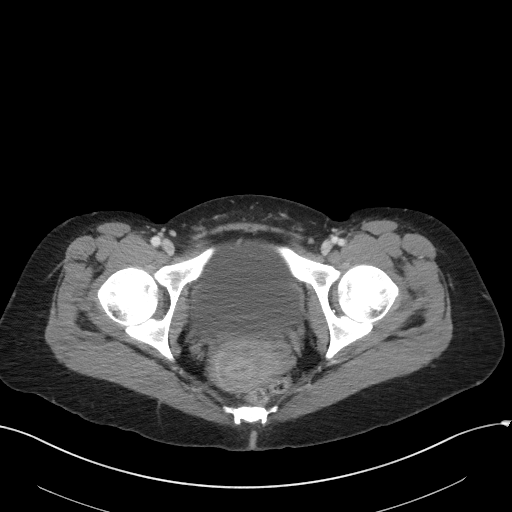
[im 33/102  soft-tissue]
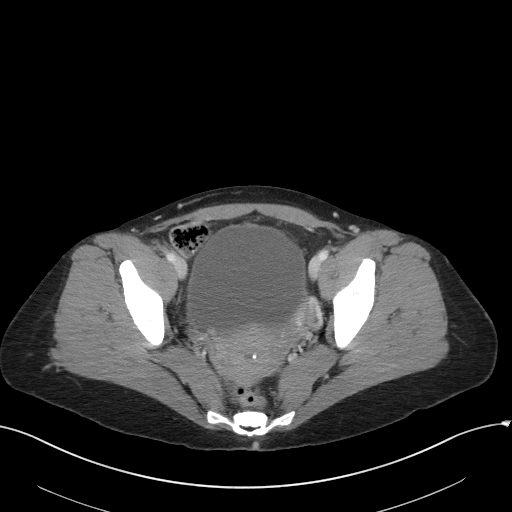
[im 41/102  soft-tissue]
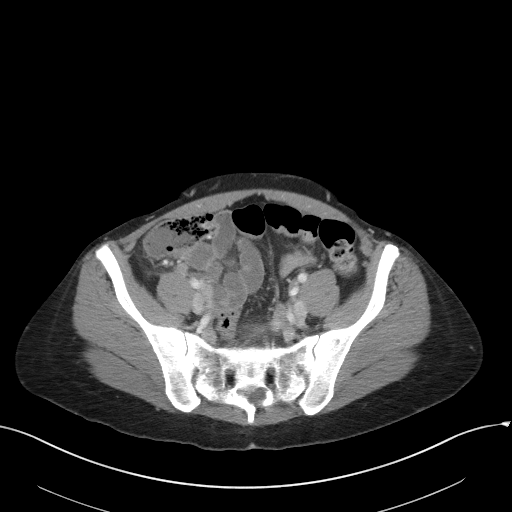
[im 49/102  soft-tissue]
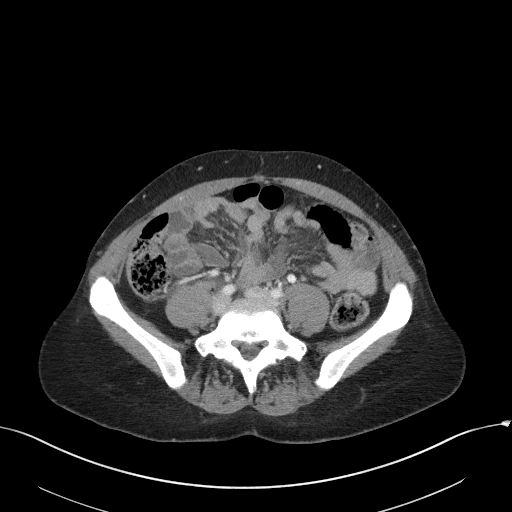
[im 57/102  soft-tissue]
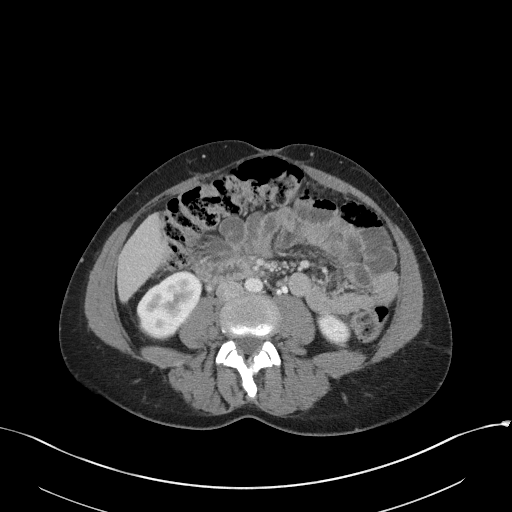
[im 65/102  soft-tissue]
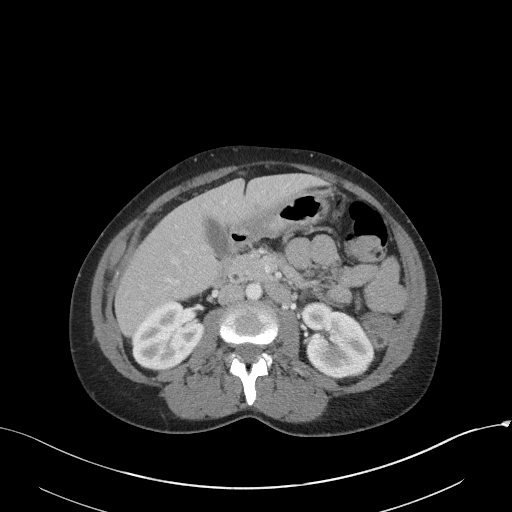
[im 73/102  soft-tissue]
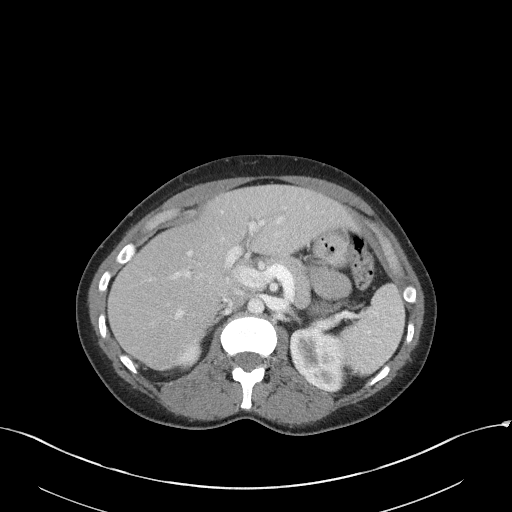
[im 73/102  bone]
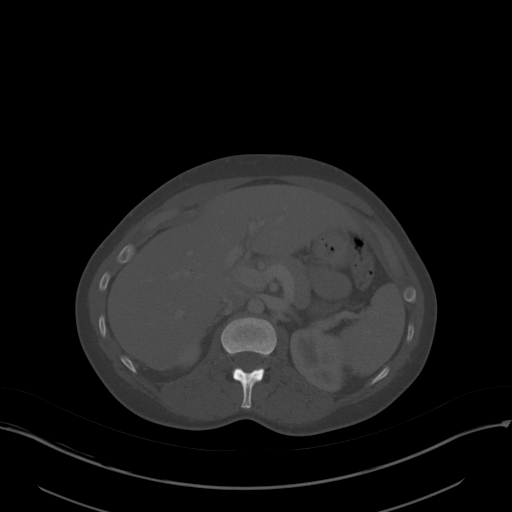
[im 81/102  soft-tissue]
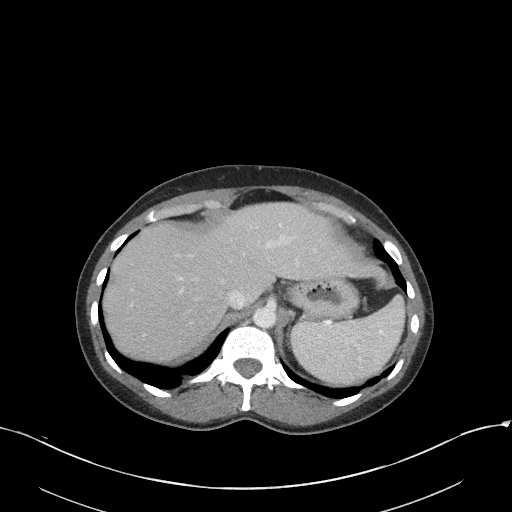
[im 89/102  soft-tissue]
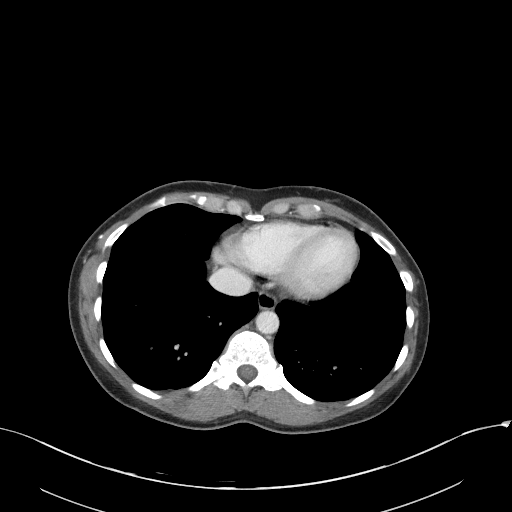
[im 97/102  soft-tissue]
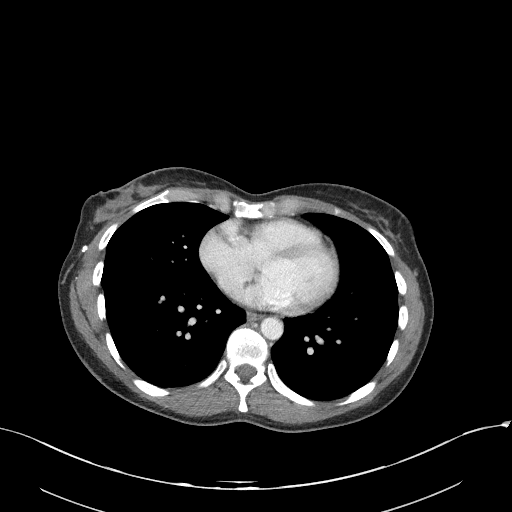

[Series 4: coronal st · coronal · 0.60mm/px · 3 of 76 slices shown]
[im 26/76  soft-tissue]
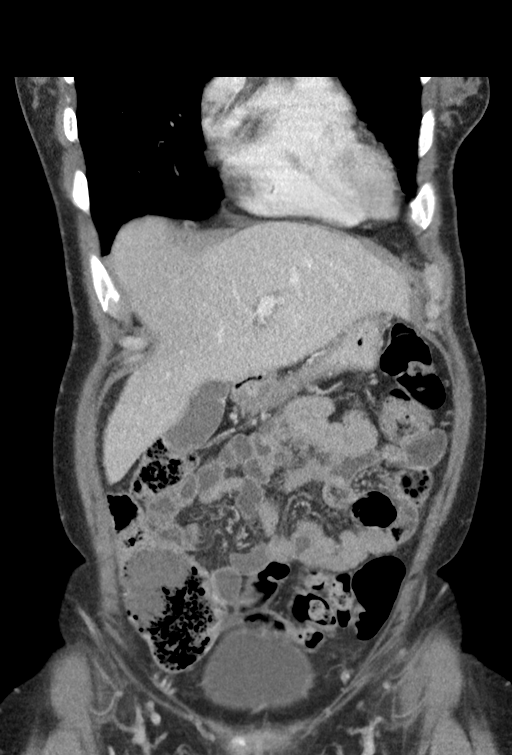
[im 34/76  soft-tissue]
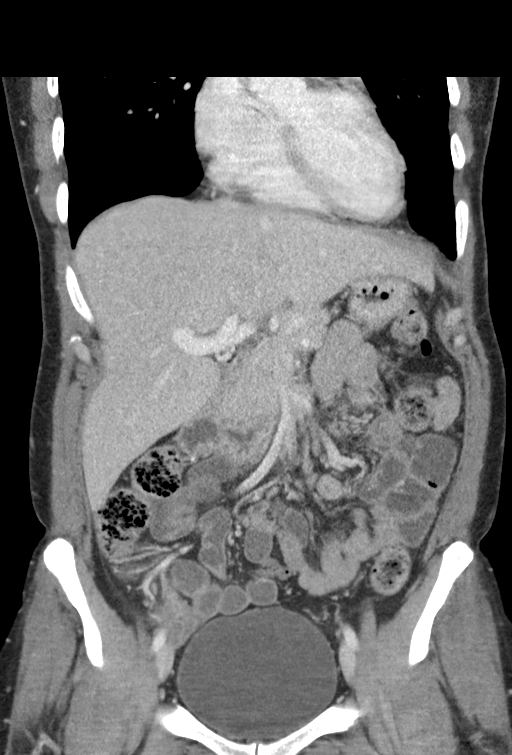
[im 42/76  soft-tissue]
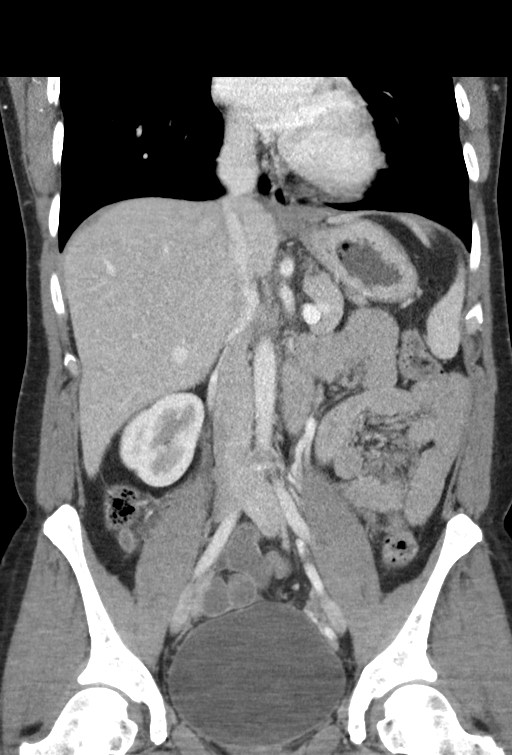

[15 of 46 positions shown; findings below may reference images not displayed]

FINDINGS: Lower chest: No significant pulmonary nodules or acute consolidative
airspace disease.

Hepatobiliary: Normal liver size. No liver mass. Normal gallbladder
with no radiopaque cholelithiasis. No biliary ductal dilatation.

Pancreas: Normal, with no mass or duct dilation.

Spleen: Normal size. No mass.

Adrenals/Urinary Tract: Normal adrenals. Normal kidneys with no
hydronephrosis and no renal mass. Normal bladder.

Stomach/Bowel: Normal non-distended stomach. Normal caliber small
bowel with no small bowel wall thickening. Diffuse dilatation of the
appendix with diffuse appendiceal wall thickening and
hyperenhancement and mild periappendiceal fat stranding, compatible
with acute appendicitis.

Appendix: Location: Right lower quadrant

Diameter: 10 mm

Appendicolith: Not present

Mucosal hyper-enhancement: Present

Extraluminal gas: Not present

Periappendiceal collection: Not present

Normal large bowel with no diverticulosis, large bowel wall
thickening or pericolonic fat stranding.

Vascular/Lymphatic: Normal caliber abdominal aorta. Patent portal,
splenic, hepatic and renal veins. No pathologically enlarged lymph
nodes in the abdomen or pelvis.

Reproductive: Normal anteverted uterus with grossly well-positioned
intrauterine device. No suspicious adnexal masses.

Other: No pneumoperitoneum, ascites or focal fluid collection.

Musculoskeletal: No aggressive appearing focal osseous lesions.
IMPRESSION: Acute appendicitis. No perforation or abscess.  No appendicolith.
